# Patient Record
Sex: Female | Born: 1935 | Hispanic: No
Health system: Southern US, Community
[De-identification: ages and names within clinical notes are randomized; demographics above are authoritative.]

---

## 2003-07-31 ENCOUNTER — Other Ambulatory Visit: Payer: Self-pay

## 2004-07-15 ENCOUNTER — Ambulatory Visit: Payer: Self-pay | Admitting: Anesthesiology

## 2004-07-15 ENCOUNTER — Observation Stay: Payer: Self-pay | Admitting: Anesthesiology

## 2004-07-20 ENCOUNTER — Ambulatory Visit: Payer: Self-pay | Admitting: Anesthesiology

## 2004-08-18 ENCOUNTER — Ambulatory Visit: Payer: Self-pay | Admitting: Anesthesiology

## 2004-10-12 ENCOUNTER — Ambulatory Visit: Payer: Self-pay | Admitting: Anesthesiology

## 2004-11-10 ENCOUNTER — Ambulatory Visit: Payer: Self-pay | Admitting: Anesthesiology

## 2004-12-02 ENCOUNTER — Ambulatory Visit: Payer: Self-pay | Admitting: Anesthesiology

## 2005-01-05 ENCOUNTER — Ambulatory Visit: Payer: Self-pay | Admitting: Anesthesiology

## 2005-02-16 ENCOUNTER — Ambulatory Visit: Payer: Self-pay | Admitting: Anesthesiology

## 2005-02-23 ENCOUNTER — Ambulatory Visit: Payer: Self-pay | Admitting: Internal Medicine

## 2005-03-24 ENCOUNTER — Ambulatory Visit: Payer: Self-pay | Admitting: Anesthesiology

## 2005-11-01 ENCOUNTER — Ambulatory Visit: Payer: Self-pay | Admitting: Unknown Physician Specialty

## 2005-11-09 ENCOUNTER — Other Ambulatory Visit: Payer: Self-pay

## 2005-11-09 ENCOUNTER — Ambulatory Visit: Payer: Self-pay | Admitting: Vascular Surgery

## 2005-12-03 ENCOUNTER — Ambulatory Visit: Payer: Self-pay

## 2005-12-20 ENCOUNTER — Ambulatory Visit: Payer: Self-pay | Admitting: Urology

## 2006-01-26 ENCOUNTER — Ambulatory Visit: Payer: Self-pay | Admitting: Unknown Physician Specialty

## 2006-02-25 ENCOUNTER — Ambulatory Visit: Payer: Self-pay | Admitting: Internal Medicine

## 2006-04-21 ENCOUNTER — Ambulatory Visit: Payer: Self-pay | Admitting: Internal Medicine

## 2006-05-12 ENCOUNTER — Ambulatory Visit: Payer: Self-pay | Admitting: Internal Medicine

## 2006-06-11 ENCOUNTER — Ambulatory Visit: Payer: Self-pay | Admitting: Internal Medicine

## 2006-07-26 ENCOUNTER — Ambulatory Visit: Payer: Self-pay | Admitting: Anesthesiology

## 2006-09-13 ENCOUNTER — Ambulatory Visit: Payer: Self-pay | Admitting: Anesthesiology

## 2006-10-12 ENCOUNTER — Ambulatory Visit: Payer: Self-pay | Admitting: Anesthesiology

## 2006-11-29 ENCOUNTER — Ambulatory Visit: Payer: Self-pay | Admitting: Anesthesiology

## 2006-12-07 ENCOUNTER — Ambulatory Visit: Payer: Self-pay | Admitting: Cardiology

## 2007-03-23 ENCOUNTER — Ambulatory Visit: Payer: Self-pay | Admitting: Internal Medicine

## 2007-05-24 ENCOUNTER — Ambulatory Visit: Payer: Self-pay | Admitting: Anesthesiology

## 2007-11-20 ENCOUNTER — Ambulatory Visit: Payer: Self-pay | Admitting: Anesthesiology

## 2007-11-29 ENCOUNTER — Ambulatory Visit: Payer: Self-pay | Admitting: Anesthesiology

## 2008-01-04 ENCOUNTER — Ambulatory Visit: Payer: Self-pay | Admitting: Anesthesiology

## 2008-02-07 ENCOUNTER — Ambulatory Visit: Payer: Self-pay | Admitting: Anesthesiology

## 2008-03-26 ENCOUNTER — Ambulatory Visit: Payer: Self-pay | Admitting: Internal Medicine

## 2008-09-16 ENCOUNTER — Ambulatory Visit: Payer: Self-pay | Admitting: Anesthesiology

## 2008-12-26 ENCOUNTER — Ambulatory Visit: Payer: Self-pay | Admitting: Anesthesiology

## 2009-01-14 ENCOUNTER — Ambulatory Visit: Payer: Self-pay | Admitting: Cardiovascular Disease

## 2009-01-14 ENCOUNTER — Ambulatory Visit: Payer: Self-pay | Admitting: Anesthesiology

## 2009-02-24 ENCOUNTER — Ambulatory Visit: Payer: Self-pay | Admitting: Anesthesiology

## 2009-03-18 ENCOUNTER — Ambulatory Visit: Payer: Self-pay | Admitting: Anesthesiology

## 2009-03-27 ENCOUNTER — Ambulatory Visit: Payer: Self-pay | Admitting: Internal Medicine

## 2009-04-21 ENCOUNTER — Ambulatory Visit: Payer: Self-pay | Admitting: Anesthesiology

## 2009-05-26 ENCOUNTER — Ambulatory Visit: Payer: Self-pay | Admitting: Anesthesiology

## 2010-04-01 ENCOUNTER — Ambulatory Visit: Payer: Self-pay | Admitting: Anesthesiology

## 2010-04-01 ENCOUNTER — Ambulatory Visit: Payer: Self-pay | Admitting: Internal Medicine

## 2010-07-15 ENCOUNTER — Ambulatory Visit: Payer: Self-pay | Admitting: Anesthesiology

## 2010-07-25 ENCOUNTER — Emergency Department: Payer: Self-pay | Admitting: Unknown Physician Specialty

## 2010-07-28 ENCOUNTER — Ambulatory Visit: Payer: Self-pay | Admitting: Anesthesiology

## 2010-07-28 ENCOUNTER — Ambulatory Visit: Payer: Self-pay | Admitting: Internal Medicine

## 2010-08-20 ENCOUNTER — Ambulatory Visit: Payer: Self-pay | Admitting: Anesthesiology

## 2010-10-06 ENCOUNTER — Observation Stay: Payer: Self-pay | Admitting: Family Medicine

## 2010-10-06 ENCOUNTER — Ambulatory Visit: Payer: Self-pay | Admitting: Anesthesiology

## 2010-10-22 ENCOUNTER — Ambulatory Visit: Payer: Self-pay | Admitting: Anesthesiology

## 2010-11-05 ENCOUNTER — Ambulatory Visit: Payer: Self-pay | Admitting: Anesthesiology

## 2010-11-13 ENCOUNTER — Ambulatory Visit: Payer: Self-pay | Admitting: Anesthesiology

## 2010-11-27 ENCOUNTER — Ambulatory Visit: Payer: Self-pay | Admitting: Anesthesiology

## 2011-01-06 ENCOUNTER — Ambulatory Visit: Payer: Self-pay | Admitting: Anesthesiology

## 2011-01-29 ENCOUNTER — Ambulatory Visit: Payer: Self-pay | Admitting: Anesthesiology

## 2011-03-04 ENCOUNTER — Ambulatory Visit: Payer: Self-pay | Admitting: Anesthesiology

## 2011-03-09 ENCOUNTER — Ambulatory Visit: Payer: Self-pay | Admitting: Anesthesiology

## 2011-04-02 ENCOUNTER — Ambulatory Visit: Payer: Self-pay | Admitting: Anesthesiology

## 2011-04-16 ENCOUNTER — Ambulatory Visit: Payer: Self-pay | Admitting: Internal Medicine

## 2011-05-04 ENCOUNTER — Ambulatory Visit: Payer: Self-pay | Admitting: Anesthesiology

## 2011-06-04 IMAGING — CT CT CERVICAL SPINE WITHOUT CONTRAST
2 series · 15 of 20 positions shown, 18 images · non-contrast
Comparison: none

REASON FOR EXAM: neck pain cystic lesion odontoid found on MRI
COMMENTS:

PROCEDURE:     CT  - CT CERVICAL SPINE WO  - November 13, 2010  [DATE]
RESULT:     Comparison: MRI of the cervical spine 10/22/2010
TECHNIQUE: Multiple axial CT images were obtained of the cervical spine,
without intravenous contrast.  Sagittal and coronal reformatted images were
constructed.

[Series 4: coronal · coronal · 0.30mm/px · 3 of 50 slices shown]
[im 10/50  bone]
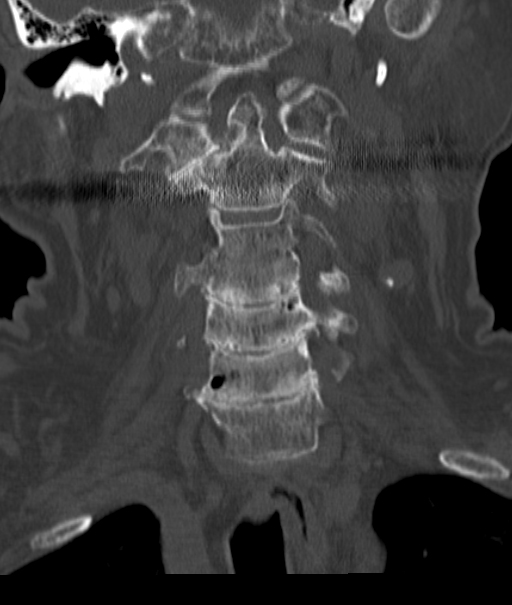
[im 20/50  bone]
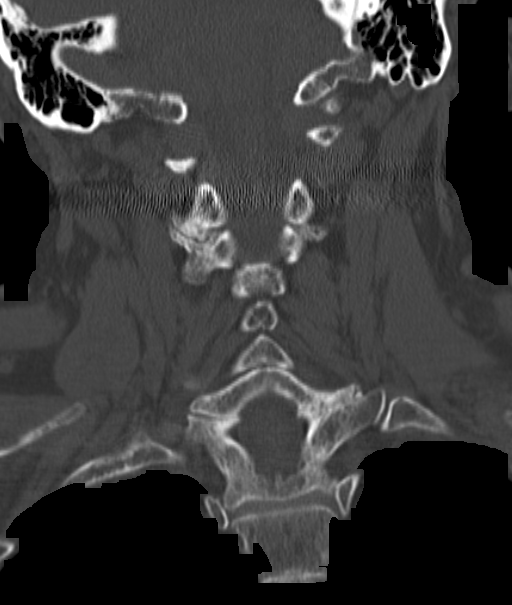
[im 30/50  bone]
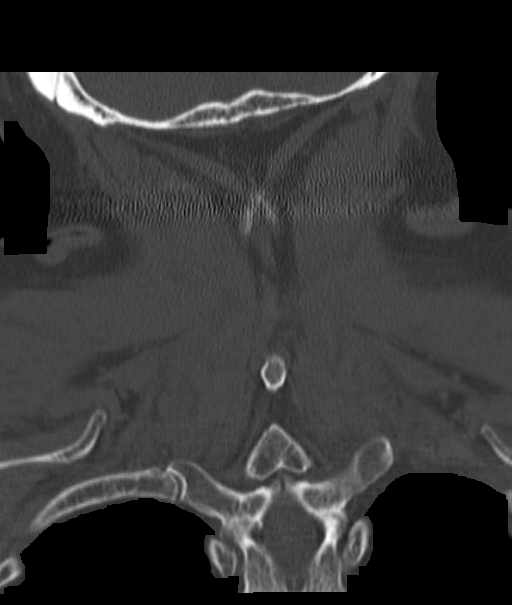

[Series 6: axial · axial · 0.30mm/px · z∈[+1129,+1273]mm · 12 of 86 slices shown, 15 images]
[im 7/86  soft-tissue]
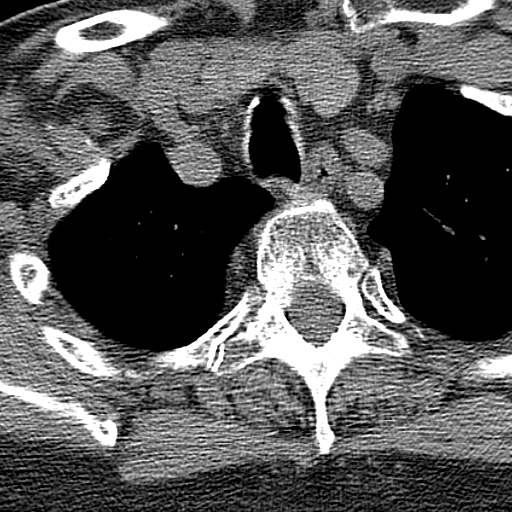
[im 7/86  bone]
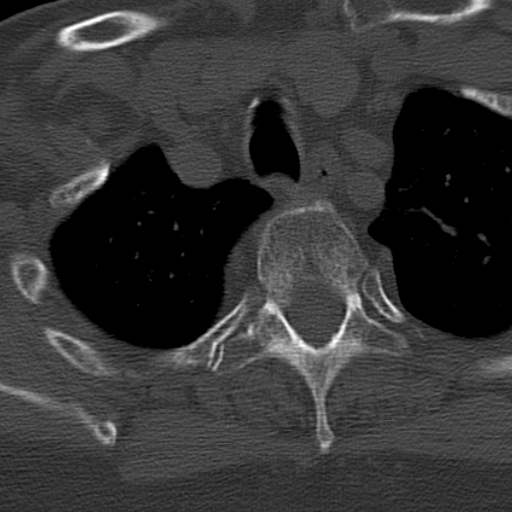
[im 14/86  bone]
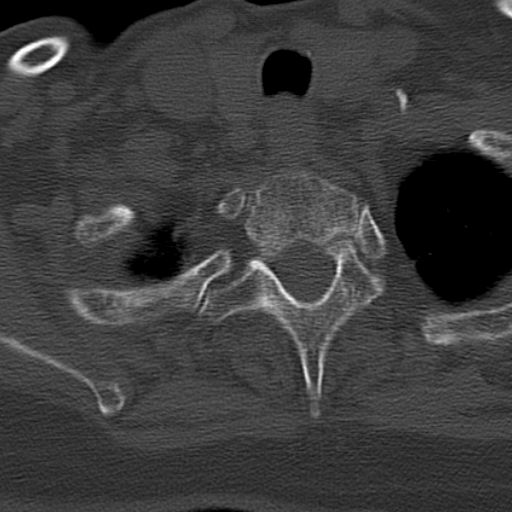
[im 20/86  bone]
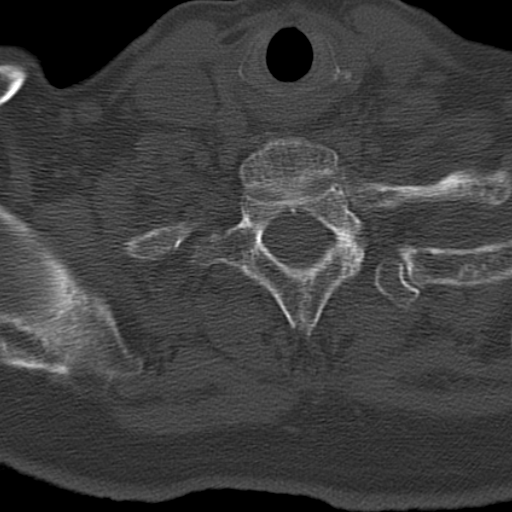
[im 27/86  bone]
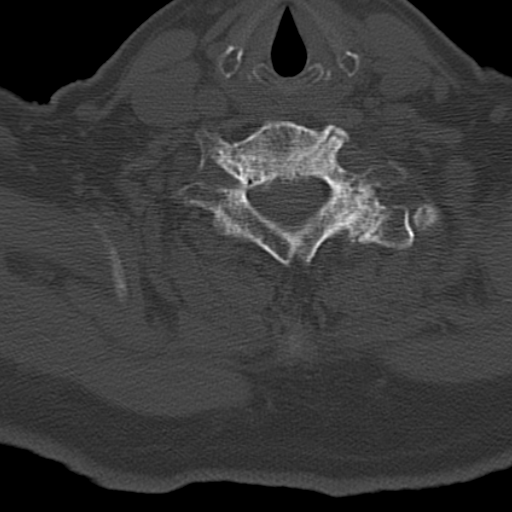
[im 33/86  soft-tissue]
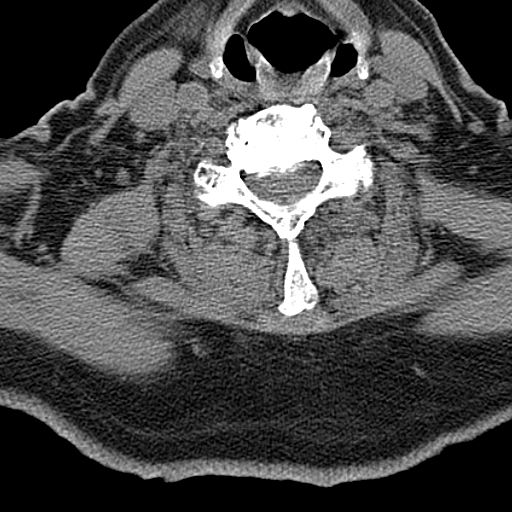
[im 33/86  bone]
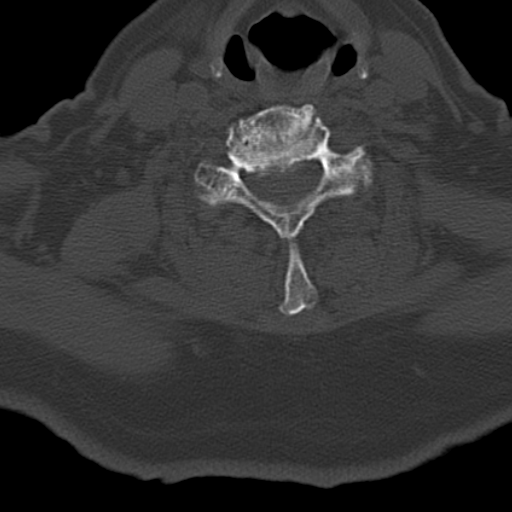
[im 40/86  bone]
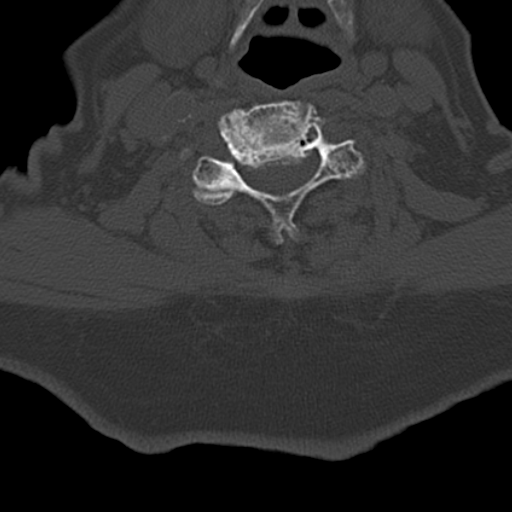
[im 46/86  bone]
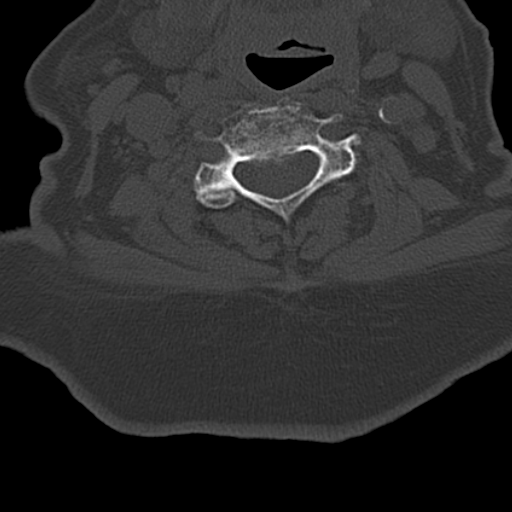
[im 53/86  bone]
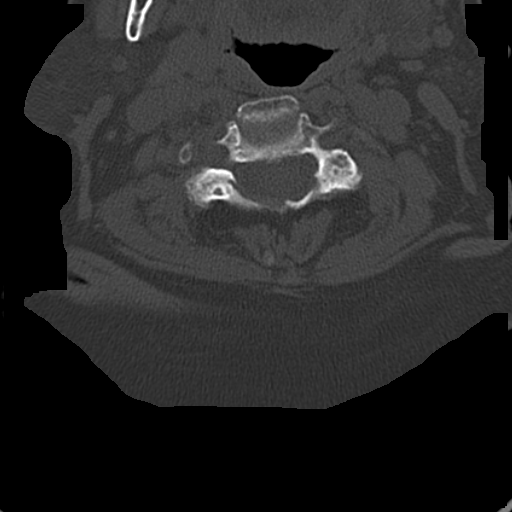
[im 59/86  soft-tissue]
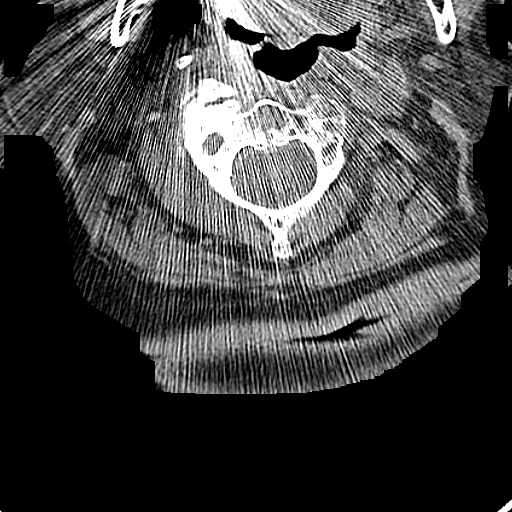
[im 59/86  bone]
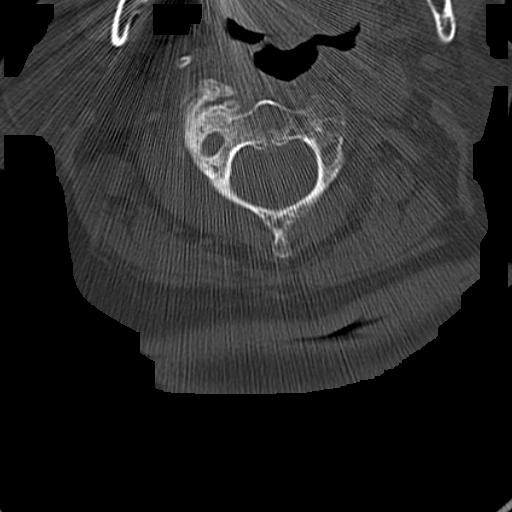
[im 66/86  bone]
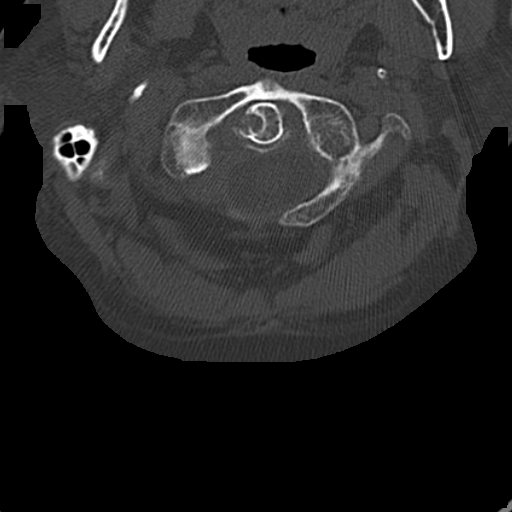
[im 72/86  bone]
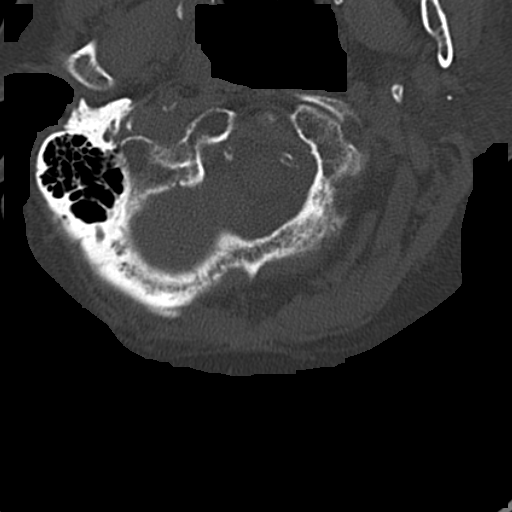
[im 79/86  bone]
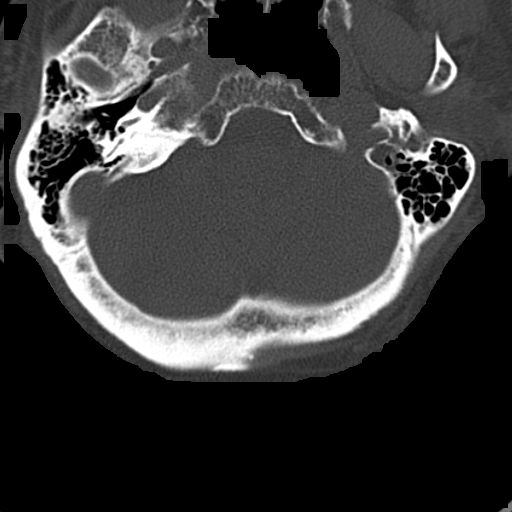

[15 of 20 positions shown; findings below may reference images not displayed]

FINDINGS: Well circumscribed lucency along the lateral margin of the odontoid process
likely represent an erosion, or potentially a subchondral cyst. This appears
to correlate with the questioned cystic lesion on the prior MRI of the
cervical spine. There are proliferative changes about the C1-C2 articulation
as well as mineralization of the alar ligaments. The findings are
nonspecific, but can be seen with CPPD arthropathy or gout.

There is 1-2 mm of anterolisthesis of C2 on C3 and approximately 2 mm of
retrolisthesis of C3 on C4 and C4 and C5. There is osseous fusion of C4-C5.
There is approximately 2 mm of anterolisthesis of C7 on T1.

C2-C3: No canal stenosis or neuroforaminal narrowing.

C3-C4: No canal stenosis or neuroforaminal narrowing.

C4-C5: Mild posterior disc osteophyte causes mild narrowing of the canal.
Uncovertebral hypertrophy causes moderate bilateral neuroforaminal narrowing.

C5-C6: Mild posterior disc osteophyte causes mild narrowing of the spinal
canal. Uncovertebral hypertrophy causes moderate bilateral neuroforaminal
narrowing.

C6-C7: Mild posterior disc osteophyte causes mild narrowing of the spinal
canal. Uncovertebral hypertrophy causes moderate bilateral neuroforaminal
narrowing.

C7-T1: No canal stenosis or neuroforaminal narrowing.
IMPRESSION: 1. Findings at the odontoid process which likely represent an erosion versus
subchondral cyst. There are additional findings at the C1-C2 articulation
which are nonspecific, but can be seen with gout or CPPD arthropathy.
2. Multilevel degenerative disease with multilevel bilateral moderate
neuroforaminal narrowing.

## 2011-06-14 ENCOUNTER — Ambulatory Visit: Payer: Self-pay | Admitting: Anesthesiology

## 2011-07-29 ENCOUNTER — Ambulatory Visit: Payer: Self-pay | Admitting: Anesthesiology

## 2011-08-23 ENCOUNTER — Ambulatory Visit: Payer: Self-pay | Admitting: Pain Medicine

## 2011-08-30 ENCOUNTER — Ambulatory Visit: Payer: Self-pay | Admitting: Pain Medicine

## 2011-09-20 ENCOUNTER — Ambulatory Visit: Payer: Self-pay | Admitting: Pain Medicine

## 2011-10-04 ENCOUNTER — Ambulatory Visit: Payer: Self-pay | Admitting: Pain Medicine

## 2011-10-21 ENCOUNTER — Ambulatory Visit: Payer: Self-pay | Admitting: Pain Medicine

## 2011-11-08 ENCOUNTER — Ambulatory Visit: Payer: Self-pay | Admitting: Pain Medicine

## 2011-11-18 ENCOUNTER — Ambulatory Visit: Payer: Self-pay | Admitting: Pain Medicine

## 2011-12-07 ENCOUNTER — Ambulatory Visit: Payer: Self-pay | Admitting: Pain Medicine

## 2011-12-16 ENCOUNTER — Ambulatory Visit: Payer: Self-pay | Admitting: Pain Medicine

## 2011-12-22 ENCOUNTER — Ambulatory Visit: Payer: Self-pay | Admitting: Pain Medicine

## 2012-01-06 ENCOUNTER — Ambulatory Visit: Payer: Self-pay | Admitting: Pain Medicine

## 2012-01-31 ENCOUNTER — Ambulatory Visit: Payer: Self-pay | Admitting: Pain Medicine

## 2012-02-15 ENCOUNTER — Ambulatory Visit: Payer: Self-pay | Admitting: Pain Medicine

## 2012-03-08 ENCOUNTER — Ambulatory Visit: Payer: Self-pay | Admitting: Pain Medicine

## 2012-06-06 ENCOUNTER — Emergency Department: Payer: Self-pay | Admitting: Unknown Physician Specialty

## 2012-06-06 LAB — URINALYSIS, COMPLETE
Glucose,UR: >=500 mg/dL (ref 0–75)
Nitrite: POSITIVE
Protein: NEGATIVE
RBC,UR: NONE SEEN /HPF (ref 0–5)
Specific Gravity: 1.022 (ref 1.003–1.030)
WBC UR: <1 /HPF (ref 0–5)

## 2012-06-06 LAB — CBC
HGB: 13.2 g/dL (ref 12.0–16.0)
Platelet: 176 10*3/uL (ref 150–440)
RBC: 4.19 10*6/uL (ref 3.80–5.20)

## 2012-06-06 LAB — COMPREHENSIVE METABOLIC PANEL
Alkaline Phosphatase: 72 U/L (ref 50–136)
Calcium, Total: 8.8 mg/dL (ref 8.5–10.1)
Co2: 30 mmol/L (ref 21–32)
EGFR (Non-African Amer.): >60
Glucose: 304 mg/dL — ABNORMAL HIGH (ref 65–99)
Osmolality: 293 (ref 275–301)
SGOT(AST): 20 U/L (ref 15–37)
Sodium: 140 mmol/L (ref 136–145)

## 2012-07-04 ENCOUNTER — Emergency Department: Payer: Self-pay | Admitting: Emergency Medicine

## 2012-07-04 LAB — URINALYSIS, COMPLETE
Ketone: NEGATIVE
Leukocyte Esterase: NEGATIVE
Nitrite: POSITIVE
Protein: NEGATIVE
RBC,UR: 5 /HPF (ref 0–5)
WBC UR: 6 /HPF (ref 0–5)

## 2012-07-04 LAB — CBC
HGB: 12.7 g/dL (ref 12.0–16.0)
MCH: 30.9 pg (ref 26.0–34.0)
MCHC: 34.9 g/dL (ref 32.0–36.0)
MCV: 89 fL (ref 80–100)
RBC: 4.1 10*6/uL (ref 3.80–5.20)
WBC: 4.3 10*3/uL (ref 3.6–11.0)

## 2012-07-04 LAB — COMPREHENSIVE METABOLIC PANEL
Albumin: 3.7 g/dL (ref 3.4–5.0)
Anion Gap: 4 — ABNORMAL LOW (ref 7–16)
BUN: 14 mg/dL (ref 7–18)
Bilirubin,Total: 0.9 mg/dL (ref 0.2–1.0)
Creatinine: 0.64 mg/dL (ref 0.60–1.30)
Glucose: 106 mg/dL — ABNORMAL HIGH (ref 65–99)
Osmolality: 277 (ref 275–301)
Potassium: 4.1 mmol/L (ref 3.5–5.1)
Sodium: 138 mmol/L (ref 136–145)
Total Protein: 6.6 g/dL (ref 6.4–8.2)

## 2012-07-06 ENCOUNTER — Inpatient Hospital Stay: Payer: Self-pay | Admitting: Internal Medicine

## 2012-07-06 LAB — CBC
HCT: 37.3 % (ref 35.0–47.0)
HGB: 13.1 g/dL (ref 12.0–16.0)
MCH: 31.5 pg (ref 26.0–34.0)
MCHC: 35.2 g/dL (ref 32.0–36.0)

## 2012-07-06 LAB — COMPREHENSIVE METABOLIC PANEL
Albumin: 3.6 g/dL (ref 3.4–5.0)
Alkaline Phosphatase: 99 U/L (ref 50–136)
Bilirubin,Total: 0.4 mg/dL (ref 0.2–1.0)
Creatinine: 0.95 mg/dL (ref 0.60–1.30)
Glucose: 122 mg/dL — ABNORMAL HIGH (ref 65–99)
Osmolality: 286 (ref 275–301)
SGPT (ALT): 8 U/L — ABNORMAL LOW (ref 12–78)
Sodium: 141 mmol/L (ref 136–145)

## 2012-07-06 LAB — URINE CULTURE

## 2012-07-06 LAB — URINALYSIS, COMPLETE
Glucose,UR: NEGATIVE mg/dL (ref 0–75)
Nitrite: NEGATIVE
Ph: 5 (ref 4.5–8.0)
Protein: NEGATIVE
RBC,UR: 1 /HPF (ref 0–5)

## 2012-07-06 LAB — TROPONIN I: Troponin-I: <0.02 ng/mL

## 2012-07-08 LAB — BASIC METABOLIC PANEL
Anion Gap: 7 (ref 7–16)
BUN: 20 mg/dL — ABNORMAL HIGH (ref 7–18)
Calcium, Total: 9.1 mg/dL (ref 8.5–10.1)
Chloride: 103 mmol/L (ref 98–107)
Co2: 30 mmol/L (ref 21–32)
Osmolality: 282 (ref 275–301)
Potassium: 3.9 mmol/L (ref 3.5–5.1)

## 2012-07-08 LAB — CBC WITH DIFFERENTIAL/PLATELET
Basophil #: 0 10*3/uL (ref 0.0–0.1)
Eosinophil #: 0.3 10*3/uL (ref 0.0–0.7)
Lymphocyte %: 41.4 %
MCH: 31.4 pg (ref 26.0–34.0)
MCHC: 35.5 g/dL (ref 32.0–36.0)
Monocyte #: 0.5 x10 3/mm (ref 0.2–0.9)
Neutrophil %: 41.5 %
RDW: 13.3 % (ref 11.5–14.5)

## 2012-07-10 LAB — CULTURE, BLOOD (SINGLE)

## 2012-08-21 ENCOUNTER — Ambulatory Visit: Payer: Self-pay | Admitting: Neurology

## 2012-08-29 ENCOUNTER — Emergency Department: Payer: Self-pay | Admitting: Emergency Medicine

## 2012-08-29 LAB — COMPREHENSIVE METABOLIC PANEL
Alkaline Phosphatase: 74 U/L (ref 50–136)
Chloride: 104 mmol/L (ref 98–107)
Co2: 31 mmol/L (ref 21–32)
Creatinine: 0.94 mg/dL (ref 0.60–1.30)
Glucose: 229 mg/dL — ABNORMAL HIGH (ref 65–99)
Osmolality: 290 (ref 275–301)
Potassium: 4 mmol/L (ref 3.5–5.1)
SGOT(AST): 11 U/L — ABNORMAL LOW (ref 15–37)
SGPT (ALT): 13 U/L (ref 12–78)
Total Protein: 7.7 g/dL (ref 6.4–8.2)

## 2012-08-29 LAB — URINALYSIS, COMPLETE
Blood: NEGATIVE
Glucose,UR: 150 mg/dL (ref 0–75)
Ketone: NEGATIVE
Protein: NEGATIVE
RBC,UR: 3 /HPF (ref 0–5)
Specific Gravity: 1.023 (ref 1.003–1.030)
Squamous Epithelial: 1

## 2012-08-29 LAB — CBC
RBC: 4.11 10*6/uL (ref 3.80–5.20)
WBC: 4.9 10*3/uL (ref 3.6–11.0)

## 2012-08-30 ENCOUNTER — Encounter (HOSPITAL_COMMUNITY): Payer: Self-pay

## 2012-08-30 ENCOUNTER — Inpatient Hospital Stay (HOSPITAL_COMMUNITY)
Admission: EM | Admit: 2012-08-30 | Discharge: 2012-08-30 | DRG: 064 | Disposition: A | Discharge status: Hospice | Payer: Medicare Other | Source: Other Acute Inpatient Hospital | Attending: Neurology | Admitting: Neurology

## 2012-08-30 DIAGNOSIS — G8190 Hemiplegia, unspecified affecting unspecified side: Secondary | ICD-10-CM | POA: Diagnosis present

## 2012-08-30 DIAGNOSIS — R4020 Unspecified coma: Secondary | ICD-10-CM

## 2012-08-30 DIAGNOSIS — Z66 Do not resuscitate: Secondary | ICD-10-CM | POA: Diagnosis present

## 2012-08-30 DIAGNOSIS — I2510 Atherosclerotic heart disease of native coronary artery without angina pectoris: Secondary | ICD-10-CM

## 2012-08-30 DIAGNOSIS — E785 Hyperlipidemia, unspecified: Secondary | ICD-10-CM | POA: Diagnosis present

## 2012-08-30 DIAGNOSIS — I10 Essential (primary) hypertension: Secondary | ICD-10-CM

## 2012-08-30 DIAGNOSIS — Z515 Encounter for palliative care: Secondary | ICD-10-CM

## 2012-08-30 DIAGNOSIS — R4701 Aphasia: Secondary | ICD-10-CM

## 2012-08-30 DIAGNOSIS — E109 Type 1 diabetes mellitus without complications: Secondary | ICD-10-CM | POA: Diagnosis present

## 2012-08-30 DIAGNOSIS — Z8673 Personal history of transient ischemic attack (TIA), and cerebral infarction without residual deficits: Secondary | ICD-10-CM

## 2012-08-30 DIAGNOSIS — Z9282 Status post administration of tPA (rtPA) in a different facility within the last 24 hours prior to admission to current facility: Secondary | ICD-10-CM

## 2012-08-30 DIAGNOSIS — G8191 Hemiplegia, unspecified affecting right dominant side: Secondary | ICD-10-CM

## 2012-08-30 DIAGNOSIS — Z951 Presence of aortocoronary bypass graft: Secondary | ICD-10-CM

## 2012-08-30 DIAGNOSIS — I6340 Cerebral infarction due to embolism of unspecified cerebral artery: Principal | ICD-10-CM | POA: Diagnosis present

## 2012-08-30 LAB — MRSA PCR SCREENING: MRSA by PCR: NEGATIVE

## 2012-08-30 MED ORDER — SENNOSIDES-DOCUSATE SODIUM 8.6-50 MG PO TABS: 1.0000 | ORAL_TABLET | Freq: Every evening | ORAL | Status: DC | PRN

## 2012-08-30 MED ORDER — PANTOPRAZOLE SODIUM 40 MG IV SOLR: 40.0000 mg | Freq: Every day | INTRAVENOUS | Status: DC

## 2012-08-30 MED ORDER — ACETAMINOPHEN 325 MG PO TABS: 650.0000 mg | ORAL_TABLET | ORAL | Status: DC | PRN

## 2012-08-30 MED ORDER — ACETAMINOPHEN 650 MG RE SUPP: 650.0000 mg | RECTAL | Status: DC | PRN

## 2012-08-30 MED ORDER — LABETALOL HCL 5 MG/ML IV SOLN
10.0000 mg | INTRAVENOUS | Status: DC | PRN
  Administered 2012-08-30 (×3): 10 mg via INTRAVENOUS
  Filled 2012-08-30 (×2): qty 4

## 2012-08-30 MED ORDER — SODIUM CHLORIDE 0.9 % IV SOLN
INTRAVENOUS | Status: DC
  Administered 2012-08-30: 03:00:00 via INTRAVENOUS

## 2012-08-30 MED ORDER — MORPHINE SULFATE 2 MG/ML IJ SOLN
INTRAMUSCULAR | Status: AC
  Administered 2012-08-30: 1 mg via INTRAVENOUS
  Filled 2012-08-30: qty 1

## 2012-08-30 MED ORDER — ONDANSETRON HCL 4 MG/2ML IJ SOLN: 4.0000 mg | Freq: Four times a day (QID) | INTRAMUSCULAR | Status: DC | PRN

## 2012-09-09 NOTE — Progress Notes (Signed)
Palliative Medicine Team consult received- discussed with patient's daughter, [name] at bedside and with Dr Pearlean Brownie - attending team managing symptoms at this time; daughter tearful stated she realizes patient's condition is grave and their wishes are for full comfort at this time; emotional support offered -daughter initially agreeable to meeting with PMT however, per Dr Pearlean Brownie there is not a need for PMT to engage at this time; PMT available for family support or additional symptom management if needed - will sign off - please re-consult if PMT can be of assistance  [name], RN 08/23/2012, 11:48 AM Palliative Medicine Team RN Liaison [phone]

## 2012-09-09 NOTE — Progress Notes (Signed)
Stroke PA notified of patient's elevated BP. Morphine 1mg  ordered. Will give ASAP. Will continue to assess.

## 2012-09-09 NOTE — Progress Notes (Addendum)
Spoke with Annie Main, NP re: pt's needs for d/c back to [facility] independent living with Hospice care. [place] uses [employer].  Contacted Denise with referral [phone]).   Jasmine December said that first we should attempt O2 wean for patient to determine if she needs that at home for comfort.  Pt was at 96% on O2 4L.  At 1430, turned her down to 2L.  Sats dropped to 94% but held steady there and pt's son-in-law in the room (a physician) said that he didn't note any change in her level of comfort. Went back at 1445 and turned her O2 off.  Within first 15 minutes pt's sats dropped from 94 to between 84-87%.  The patient is exhibiting no increased work of breathing or other signs of discomfort.  Spoke again to Annie Main, NP who said that at this time we would not order home O2.  Faxed facesheet, progress notes, orders to [facility] at [phone]. Received call at 1535 that fax was received and that the bed could be delivered to the patient's home today.  Pt discharging to:  [address], [place], [place]; c/o [employer]  Daughter Jesse Fall will be there to receive the delivery and set up of the hospital bed.  Her cell is [phone].  NP Guy Franco notified that pt will be able to d/c home today so that she can do discharge summary.

## 2012-09-09 NOTE — Consult Note (Signed)
Referring Physician: ED [facility].    Chief Complaint: stroke status post IV tpa.   HPI:                                                                                                                                         Debra Bailey is an 79 y.o. female female with a past medical history significant for hypertension, diabetes type 1, hyperlipidemia, CAD status post CABG few years ago, TIA's characterized by language dysfunction (last TIA Dec 2013), transferred from [facility] for further stroke post IV tpa. I did review the available clinical notes, and also spoke to patient's family member as well as the ED physician involved in the decision to give IV thrombolysis and they are in agreement that Debra Bailey was last seen normal around 7 pm tonight when she was found to have difficulty expressing herself which prompted an urgent evaluation at [facility] ED. Upon arrival to that institution, she was described as " aphasic, motor greater than receptive aphasia, with right sided weakness and overall NIHSS 7".  CT brain did not show acute intracranial abnormality and It was determined that she met criteria for IV thrombolysis which was administer approximately  2 1/2 hour after onset of symptoms. Nursing staff here at the NICU is concerned that she is less responsive that when she arrived to the unit. Importantly, she is a DNR/DNI and family expressed their wishes of no aggressive intervention.  LSN: 7 pm tonight. tPA Given: yes, as she fulfilled inclusion and exclusion criteria for IV thrombolysis.   No past surgical history on file.  No family history on file. Social History:  has no tobacco, alcohol, and drug history on file.  Allergies: Allergies not on file  Medications:                                                                                                                           I have reviewed the patient's current  medications.  ROS:  History obtained from unobtainable from patient due to mental status  Physical exam: unresponsive but no distress.Blood pressure 189/74, pulse 80, resp. rate 19, weight 69 kg (152 lb 1.9 oz), SpO2 100.00%. Head: normocephalic. Neck: supple, no bruits, no JVD. Cardiac: no murmurs. Lungs: clear. Abdomen: soft, no tender, no mass. Extremities: no edema.   Neurologic Examination:                                                                                                      Mental Status: Unresponsive. Cranial Nerves: Pupils 4 mm bilaterally, reactive to light. No gaze preference. Doesn't blink to threat. EOM intact on doll's maneuver. No nystagmus. Face seems to be asymmetric, with right sided weakness. Motor: Dense right hemiplegia. Sensory: withdraws to noxious stimuli but doesn't localize pain. Deep Tendon Reflexes: 2+ and symmetric throughout Plantars: upgoing bilaterally. Cerebellar: Can not be tested. Gait: can not be tested. CV: pulses palpable throughout        Assessment: 79 y.o. female with multiple risk factors for stroke, now with a left cortical syndrome suggestive in the context of a probable left MCA territory ischemic infarct. She is unresponsive with dense right hemiplegia, and was globally aphasic upon initial evaluation in the ED.  Declining mental status since arrival to the NICU but she is DNR/DNI and family is not in favor of aggressive intervention, and thus I decided no to request an earlier CT brain now to exclude the possibility of post tpa cerebral hemorrhage as the results of the test will not change family decision regarding aggressive intervention. Will admit and follow post IV tpa protocol. Stroke team to resume care in the morning and make further recommendations accordingly.  Wyatt Portela,  MD [facility] [phone]  11-Sep-2012, 1:04 AM

## 2012-09-09 NOTE — Progress Notes (Signed)
Stroke Team Progress Note  HISTORY Debra Bailey is an 79 y.o. female female with a past medical history significant for hypertension, diabetes type 1, hyperlipidemia, CAD status post CABG few years ago, TIA's characterized by language dysfunction (last TIA Dec 2013), transferred from [facility] for further stroke post IV tpa.  I did review the available clinical notes, and also spoke to patient's family member as well as the ED physician involved in the decision to give IV thrombolysis and they are in agreement that Debra Bailey was last seen normal around 7 pm tonight when she was found to have difficulty expressing herself which prompted an urgent evaluation at [facility] ED. Upon arrival to that institution, she was described as " aphasic, motor greater than receptive aphasia, with right sided weakness and overall NIHSS 7". CT brain did not show acute intracranial abnormality and It was determined that she met criteria for IV thrombolysis which was administer approximately 2 1/2 hour after onset of symptoms.  Nursing staff here at the NICU is concerned that she is less responsive that when she arrived to the unit.  Importantly, she is a DNR/DNI and family expressed their wishes of no aggressive intervention.  LSN: 7 pm 08/29/12 tPA Given: yes, as she fulfilled inclusion and exclusion criteria for IV thrombolysis.  The patient`s neurological condition declined enroute in transit to [facility] last pm  and family decided on comfort care and no aggressive treatment and declined doing CT head to determine cause of neurological decline. I spoke with patient`s son in law and daughter both who are power of attorney and confirmed this. Daughter informs me that patient had 4-5 episodes of transient expressive language difficulties previously felt to be TIAs but recently Dr Sherryll Burger neurologist in [place] felt these could be partial seizures. She had also had memory decline and several  falls in last 4-5 months.     SUBJECTIVE Her  daughter is at the bedside.  Overall she feels her condition is unchanged OBJECTIVE Most recent Vital Signs: Filed Vitals:   08/23/2012 0800 09/06/2012 0900 09/05/2012 0930 08/13/2012 0945  BP: 187/84 195/78 195/78 177/77  Pulse: 103 103 107 100  Temp:      TempSrc:      Resp: 18 18 19 19   Height:      Weight:      SpO2: 100% 100% 100% 100%   CBG (last 3)  No results found for this basename: GLUCAP,  in the last 72 hours  IV Fluid Intake:   . sodium chloride 75 mL/hr at 08/16/2012 0900    MEDICATIONS   PRN:  acetaminophen, acetaminophen, labetalol, ondansetron (ZOFRAN) IV  Diet:  NPO   Activity:  Bedrest  DVT Prophylaxis:  SCDs CLINICALLY SIGNIFICANT STUDIES Basic Metabolic Panel: No results found for this basename: NA, K, CL, CO2, GLUCOSE, BUN, CREATININE, CALCIUM, MG, PHOS,  in the last 168 hours Liver Function Tests: No results found for this basename: AST, ALT, ALKPHOS, BILITOT, PROT, ALBUMIN,  in the last 168 hours CBC: No results found for this basename: WBC, NEUTROABS, HGB, HCT, MCV, PLT,  in the last 168 hours Coagulation: No results found for this basename: LABPROT, INR,  in the last 168 hours Cardiac Enzymes: No results found for this basename: CKTOTAL, CKMB, CKMBINDEX, TROPONINI,  in the last 168 hours Urinalysis: No results found for this basename: COLORURINE, APPERANCEUR, LABSPEC, PHURINE, GLUCOSEU, HGBUR, BILIRUBINUR, KETONESUR, PROTEINUR, UROBILINOGEN, NITRITE, LEUKOCYTESUR,  in the last 168 hours Lipid Panel No results found  for this basename: chol, trig, hdl, cholhdl, vldl, ldlcalc   HgbA1C  No results found for this basename: HGBA1C    Urine Drug Screen:   No results found for this basename: labopia, cocainscrnur, labbenz, amphetmu, thcu, labbarb    Alcohol Level: No results found for this basename: ETH,  in the last 168 hours  No results found.  CT of the brain   At [facility] old right basal ganglia lacune and  moderte generalized atrophy and small vessel disease changes.  MRI of the brain  Not done  MRA of the brain  Not done  2D Echocardiogram   Not done Carotid Doppler   Not done CXR    EKG   Not done Therapy Recommendations deferred  Physical Exam  Frail elderly Caucasian lady njot in distresss.  Afebrile. Head is nontraumatic. Neck is supple without bruit. . Cardiac exam no murmur or gallop. Lungs are clear to auscultation. Distal pulses are well felt. Neurological Exam : comatose unresponsive. Pupils 6 mm fixed. corneals sluggish. Dolls eye movements absent. Face symmetric. Tongue midline. Weak cough and gag. No spontaneous limb movements. Minimal withdrwal in LE to pain and none in UE,Plantars upgoing bilaterally. ASSESSMENT Ms. Debra Bailey is a 79 y.o. female  with multiple risk factors for stroke, presented to [facility] with Lt MCA branch infarct y`day pm and given ivTPA but neurologically declined in transit - unclear wether from post tpa hemorrhage versus new bilateral infarcts but she is DNR/DNI and family is not in favor of aggressive intervention, and thus I decided no to request an earlier CT brain now to exclude the possibility of post tpa cerebral hemorrhage as the results of the test will not change family decision regarding aggressive intervention.  Hospital day # 0  TREATMENT/PLAN Continue comfort care DNR/DNI. Transfer to palliative care unit and possible palliative care in SNF in [place] if family desires D/W daughter and son in law who have HPOA   Delia Heady, MD Medical Director [facility] Pager: [phone] 08/17/2012 10:16 AM

## 2012-09-09 NOTE — Discharge Summary (Signed)
Stroke Discharge Summary  Patient ID: Debra Bailey   MRN: [id number]      DOB: May 30, 1926  Date of Admission: 09/06/12 Date of Discharge: 09/06/2012  Attending Physician:  Darcella Cheshire, MD, Stroke MD  Consulting Physician(s):   Treatment Team:  Md Stroke, MD Palliative Doristine Johns  Patient's PCP:  Barbette Reichmann, MD  Discharge Diagnoses:  Principal Problem:   Right hemiplegia Active Problems:   Receptive aphasia   Accelerated hypertension   Coronary artery disease   Comatose  BMI: Body mass index is 28.76 kg/(m^2).     Medication List    STOP taking these medications       acetaminophen 500 MG tablet  Commonly known as:  TYLENOL     carbidopa-levodopa 25-100 MG per tablet  Commonly known as:  SINEMET CR     clopidogrel 75 MG tablet  Commonly known as:  PLAVIX     docusate sodium 100 MG capsule  Commonly known as:  COLACE     DULoxetine 30 MG capsule  Commonly known as:  CYMBALTA     gabapentin 300 MG capsule  Commonly known as:  NEURONTIN     glyBURIDE-metformin 5-500 MG per tablet  Commonly known as:  GLUCOVANCE     metoprolol succinate 50 MG 24 hr tablet  Commonly known as:  TOPROL-XL     nitroGLYCERIN 0.4 MG SL tablet  Commonly known as:  NITROSTAT     pravastatin 40 MG tablet  Commonly known as:  PRAVACHOL     vitamin C 500 MG tablet  Commonly known as:  ASCORBIC ACID     Vitamin D3 1000 UNITS Caps        LABORATORY STUDIES:   Patient arrived at [facility] from [facility], see that facility labs. CBC No results found for this basename: wbc, rbc, hgb, hct, plt, mcv, mch, mchc, rdw, neutrabs, lymphsabs, monoabs, eosabs, basosabs   CMP No results found for this basename: na, k, cl, co2, glucose, bun, creatinine, calcium, prot, albumin, ast, alt, alkphos, bilitot, gfrnonaa, gfraa   COAGS No results found for this basename: INR, PROTIME   Lipid Panel No results found for this basename: chol, trig, hdl, cholhdl,  vldl, ldlcalc   HgbA1C  No results found for this basename: HGBA1C   Cardiac Panel (last 3 results) No results found for this basename: CKTOTAL, CKMB, TROPONINI, RELINDX,  in the last 72 hours Urinalysis No results found for this basename: colorurine, appearanceur, labspec, phurine, glucoseu, hgbur, bilirubinur, ketonesur, proteinur, urobilinogen, nitrite, leukocytesur   Urine Drug Screen  No results found for this basename: labopia, cocainscrnur, labbenz, amphetmu, thcu, labbarb    Alcohol Level No results found for this basename: eth    SIGNIFICANT DIAGNOSTIC STUDIES  CT of the brain At [facility] old right basal ganglia lacune and moderte generalized atrophy and small vessel disease changes.     History of Present Illness   Debra Bailey is an 79 y.o. female female with a past medical history significant for hypertension, diabetes type 1, hyperlipidemia, CAD status post CABG few years ago, TIA's characterized by language dysfunction (last TIA Dec 2013), transferred from [facility] for further stroke post IV tpa.   Dr. Pearlean Brownie reviewed the available clinical notes, and also spoke to patient's family member as well as the ED physician involved in the decision to give IV thrombolysis and they are in agreement that Debra Bailey was last seen normal around 7 pm 08/29/2012 when she was found to  have difficulty expressing herself which prompted an urgent evaluation at [facility] ED. Upon arrival to that institution, she was described as " aphasic, motor greater than receptive aphasia, with right sided weakness and overall NIHSS 7". CT brain did not show acute intracranial abnormality and It was determined that she met criteria for IV thrombolysis which was administer approximately 2 1/2 hour after onset of symptoms.   Of note, daughter did inform Dr. Pearlean Brownie that patient had 4-5 episodes of transient expressive language difficulties previously felt to be TIAs but  recently Dr Sherryll Burger neurologist in [place] felt these could be partial seizures. She had also had memory decline and several falls in last 4-5 months   LSN: 7 pm   08/29/12   tPA Given: yes, as she fulfilled inclusion and exclusion criteria for IV thrombolysis.   The patient`s neurological condition declined enroute in transit to [facility] last pm and family decided on comfort care and no aggressive treatment and declined doing CT head to determine cause of neurological decline. Dr Pearlean Brownie discussed with patient`s son in law and daughter both who are power of attorney and confirmed this. Patient will be discharged to [facility] with Hospice End of Life Care.  Discharge Exam  Blood pressure 194/82, pulse 102, temperature 100.6 F (38.1 C), temperature source Axillary, resp. rate 26, height 5\' 1"  (1.549 m), weight 69 kg (152 lb 1.9 oz), SpO2 83.00%. I have been notified by phone by discharge team/case manager Marcelino Duster that the patient has been taken off of O2 by nasal canula and her oxygen saturations are above 90%. Family has felt that she is not suffering.  Physical Exam Frail elderly Caucasian lady njot in distresss. Afebrile. Head is nontraumatic. Neck is supple without bruit. . Cardiac exam no murmur or gallop. Lungs are clear to auscultation. Distal pulses are well felt.  Neurological Exam : comatose unresponsive. Pupils 6 mm fixed. corneals sluggish. Dolls eye movements absent. Face symmetric. Tongue midline. Weak cough and gag. No spontaneous limb movements. Minimal withdrwal in LE to pain and none in UE,Plantars upgoing bilaterally  Discharge Diet   NPO    Discharge Plan    Disposition:  To [facility]isted Living with Hospice Care   May contact [facility], MD (primary MD) or  Dr. Delia Heady (neurologist) for questions  End of life treatment as outlined per Hospice: comfort care.  40 minutes were spent preparing discharge.  Signed  Gwendolyn Lima. Manson Passey, [facility], MBA,  MHA [facility] Pager: [phone] 08/20/2012 5:08 PM  I have personally examined this patient, reviewed pertinent data and developed the plan of care. I agree with above.  Delia Heady, MD Medical Director [facility] Pager: [phone] 09/05/2012 1:04 PM

## 2012-09-09 NOTE — Progress Notes (Signed)
UR completed 

## 2012-09-09 NOTE — Clinical Social Work Note (Signed)
Clinical Social Worker met with patient son in law at the bedside to further discuss patient discharge plans.  CSW spoke with [facility] Independent Living RN who states that patient may return to independent living with Hospice following as long as patient family was willing to provide live in care.  Patient family is agreeable with the plan.  CM making arrangements with [facility] for hospital bed/oxygen needed for patient return home.  Patient family understanding of patient return tomorrow pending hospice equipment and set up prepared.  CSW remains available for support as needed.  Macario Golds, [place] [phone]

## 2012-09-09 NOTE — Progress Notes (Signed)
Nutrition Brief Note  Chart reviewed. Family does not want aggressive care. Per RN pt will be transitioning to comfort care.  No nutrition interventions warranted at this time.  Please re-consult as needed.   Kendell Bane RD, LDN, CNSC [phone] Pager [phone] After Hours Pager

## 2012-09-09 NOTE — Clinical Social Work Psychosocial (Signed)
     Clinical Social Work Department BRIEF PSYCHOSOCIAL ASSESSMENT 08/12/2012  Patient:  Debra Bailey,Debra Bailey     Account Number:  [id number]     Admit date:  09/02/2012  Clinical Social Worker:  Verl Blalock  Date/Time:  08/27/2012 12:00 N  Referred by:  Physician  Date Referred:  08/20/2012 Referred for  Residential hospice placement   Other Referral:   Patient from [facility]   Interview type:  Family Other interview type:   Patient daughter at bedside    PSYCHOSOCIAL DATA Living Status:  FACILITY Admitted from facility:  [facility] Level of care:  Independent Living Primary support name:  Debra Bailey  [phone] Primary support relationship to patient:  CHILD, ADULT Degree of support available:   Strong    CURRENT CONCERNS Current Concerns  Post-Acute Placement   Other Concerns:   Patient to have Hospice following    SOCIAL WORK ASSESSMENT / PLAN Clinical Social Worker met with patient daughter at bedside to offer support and discuss patient needs at discharge. Patient daughter states that patient was a resident at [facility] at the time of patient hospitalization.  Patient has lived there for many years. Patient has a boyfriend that lives 2 doors down named Debra Bailey and they have been together for 2 years and 7 months. Patient daughter feels that patient wishes would be for patient to return to [facility] with Hospice following.  Patient daughter is willing to stay around the clock with patient as needed.  CSW contacted facility and awaits return call to determine if that is a possibility.    Patient daughter states that if patient is unable to return to her Independent apartment with Hospice following she would like her to return to her home with Hospice following.  Patient family is anxious to get patient settled where she will be comfortable.  CSW will remain available for emotional support and to  facilitate patient discharge needs.   Assessment/plan status:  Psychosocial Support/Ongoing Assessment of Needs Other assessment/ plan:   Information/referral to community resources:   Patient daughter is retired from Teacher, music and is well networked in the [employer].  Patient daughter is willing to discuss Hospice options but is agreeable to whatever agency is used by [facility].    PATIENTS/FAMILYS RESPONSE TO PLAN OF CARE: Patient is currently unresponsive laying in the bed with full comfort care measures being carried out.  Patient daughter remains at bedside and expressed some tearful concerns regarding the findings from MD but understands that patient is actively dying and just wants her to be comfortable.  Patient and patient family with good supports and are coping appropriately.  Patient daughter verbalized her appreciation for CSW support and concern.

## 2012-09-09 NOTE — Progress Notes (Signed)
Palliative Medicine Team SW Pt discussed in PMT rounds, came by to offer emotional support to dtr prior to knowledge of cancelled PMT consult. Dtr very appreciative of visit and opportunity to express her relief that pt "is the most comfortable I've seen her in a long time". Dtr reports pt has had several medical complications in the recent past. Dtr expressed good coping knowing that death is "a part of life". Dtr engaged in some reminiscence therapy about pt's life raising three children and her social support at the independent living facility. Dtr reports pt's two sons live in [place] and are aware of pt's decline. During our visit, pt's significant other from [facility] arrived and was quite tearful at seeing her "this way". Provided emotional support to pt's s/o who reports being with pt when she had her "episode" and his shock that her prognosis is now so poor. S/o reports he has never had much experience with end of life within the hospital setting, even with his own parent and wife who died 10 years ago. S/o finds visiting pt very distressing; affirmed his right to choose to remember her "as she was". Encouraged good self care to all, provided my contact info as needs arise. Will defer to primary team CSW for further emotional support needs.   Kennieth Francois, LCSWA PMT phone [phone] Pager [phone]

## 2012-09-09 NOTE — Progress Notes (Signed)
Patient ID: Debra Bailey, female   DOB: [birth year], 79 y.o.   MRN: [id number]  Dr. Pearlean Brownie had a long conversation with the patient's daughter in the room this morning and the patient's son-in-law by telephone. The patient's family would like comfort care measures only. Please refer to Dr. Marlis Edelson complete note from earlier this morning. Arrangements will be made to transfer the patient to [id number].  Delton See PA-C [facility] Pager [phone] 2012/09/20, 11:31 AM Delia Heady, MD Medical Director [facility] Pager: [phone] 09/20/2012 5:51 PM

## 2012-09-09 DEATH — deceased

## 2014-10-29 NOTE — Discharge Summary (Signed)
DATE OF BIRTH:  [birth year]  DATE OF ADMISSION:  07/06/2012 DATE OF DISCHARGE:  07/08/2012  DISCHARGE DIAGNOSES:  1.  Encephalopathy.  2.  Urinary tract infection.  3.  Possible aphasia and transient ischemic attack.  4.  Coronary artery disease, stable.   DISCHARGE MEDICATIONS:  Per discharge instruction sheet.   HOSPITAL COURSE:  The patient was admitted with speech irregularities felt to be aphasia, babbling speech intermittently for 48 hours per her boyfriend, who lives with her. She slowly improved over the course of her hospitalization such that her speech was normal and her usual speech pattern per her boyfriend. She may have had some mild confusion still from my observation. Again, this was thought to be her usual state of speaking. Echocardiogram was done, which was normal. MRI of her brain was done on the 27th, which looked normal. She had no atrial fibrillation noted on prolonged telemetry findings. She was on aspirin for coronary artery disease presently. This will be switched to Plavix. Head CT was normal on admission, as were troponins. She had a urine culture done on the 24th, which showed Enterobacter, which was sensitive to ciprofloxacin. So, she will be continued on a 7-day course of that, which had been started prior to hospitalization. I discussed with her (Dictation Anomaly)today to follow up soon with Dr. Marcello FennelHande, and this will be on the discharge handout that she gets as well. She understands to present quickly if she has recurrent aphasia symptoms, and her boyfriend agrees to bring her or call EMS depending on the situation where they are and how close to the hospital they are, etc.   Please note it took approximately 35 minutes to do all discharge tasks.    ____________________________ Marya AmslerMarshall W. Dareen PianoAnderson, MD mwa:ms D: 07/08/2012 09:34:34 ET T: 07/09/2012 17:26:28 ET JOB#: [id number]  cc: Marya AmslerMarshall W. Dareen PianoAnderson, MD, <Dictator> Lauro RegulusMARSHALL W Geetika Laborde MD ELECTRONICALLY SIGNED  07/10/2012 7:48

## 2014-10-29 NOTE — H&P (Signed)
DATE OF BIRTH:  [birth year]  PRIMARY CARE PHYSICIAN:  Dr. Marcello FennelHande   CHIEF COMPLAINT:  Slurred speech and aphasia.   HISTORY OF PRESENT ILLNESS:  This is an 81 year old female, who presents to the Emergency Room due to difficulty talking/aphasia and also slurred speech. The patient resides at [facility] when one of her friends noticed that she woke up this afternoon and was talking, but not making any sense. He therefore informed the staff at [facility], who then sent her over to the ER for further evaluation. In the Emergency Room, when the patient arrived, she was aphasic and was not able to talk much. Symptoms are very suspicious for a stroke. Therefore, a neurologist was consulted. Although her aphasia has significantly improved, therefore, she did not require any thrombolytics. Given her progressive neurological symptoms, hospitalist services were contacted for further treatment and evaluation. The patient does admit to a mild headache presently. No nausea, no vomiting, no chest pain, no shortness of breath. No fevers, chills, cough. No numbness, no tingling, no seizure-type activity, and no other acute associated symptoms presently.   REVIEW OF SYSTEMS:   CONSTITUTIONAL:  No documented fever, no weight gain, no weight loss.  EYES:  No blurred or double vision.  ENT:  No tinnitus, no postnasal drip, no redness of the oropharynx.  RESPIRATORY:  No cough, no wheeze, no hemoptysis, no dyspnea.  CARDIOVASCULAR:  No chest pain, no orthopnea, no palpitations, no syncope.  GASTROINTESTINAL:  No nausea, no vomiting, no diarrhea, no abdominal pain, no melena or hematochezia.  GENITOURINARY:  No dysuria, no hematuria.  ENDOCRINE:  No polyuria or nocturia. No heat or cold intolerance.   HEMATOLOGIC:  No anemia, no bruising, no bleeding.  INTEGUMENT:  No rashes. No lesions.  MUSCULOSKELETAL:  No arthritis, no swelling, no gout.  NEUROLOGIC:  No numbness, no tingling, no ataxia, no  seizure-type activity.  PSYCHIATRIC:  No anxiety,  no insomnia, no ADD. Positive depression.   ALLERGIES:  DEMEROL, EPINEPHRINE, MACROBID and PENICILLIN.   SOCIAL HISTORY:  No smoking. No alcohol abuse. No illicit drug abuse. Lives at [facility] independent living.   FAMILY HISTORY:  Mother died from cancer of unknown type. Father died from lung cancer.   CURRENT MEDICATIONS: 1.  Aspirin 81 mg daily.  2.  Sinemet 25/100 one tab t.i.d.  3.  Ciprofloxacin 500 mg b.i.d.  4.  Cymbalta 30 mg daily.  5.  Glyburide metformin 5/500 one tab b.i.d.  6.  Neurontin 300 mg b.i.d.  7.  Sublingual nitroglycerin as needed.  8.  Pravachol 40 mg daily.  9.  Senokot 1 tab at bedtime.  10.  Toprol 50 mg daily.  11.  Tylenol extra strength 1 to 2 tabs q. 4 hours as needed.  12.  Vitamin B12 at 1000 mcg monthly.  13.  Vitamin C 500 mg daily.  14.  Vitamin D3 at 1000 international units daily.   PHYSICAL EXAMINATION ON ADMISSION:  VITAL SIGNS:  Temperature is 98.8, pulse 76, respirations 18, blood pressure 192/88, sats 97% on 2 L nasal cannula.  GENERAL:  She is a pleasant appearing female in no apparent distress.  HEENT:  She is atraumatic, normocephalic. Extraocular muscles are intact. Pupils equal and reactive to light. Sclerae is anicteric. No conjunctival injection. No pharyngeal erythema.  NECK:  Supple. There is no jugular venous distention, no bruits, no lymphadenopathy, no thyromegaly.  HEART:  Regular rate and rhythm. No murmurs, rubs or clicks.  LUNGS:  Clear  to auscultation bilaterally. No rales, no rhonchi, no wheezes.  ABDOMEN:  Soft, flat, nontender, nondistended. Has good bowel sounds. No hepatosplenomegaly appreciated.  EXTREMITIES:  No evidence of any cyanosis, clubbing or peripheral edema. Has +2 pedal and radial pulses bilaterally.  NEUROLOGIC:  The patient is alert, awake, oriented x 2. Moves all extremities spontaneously. Does have a positive Parkinsonian tremor. Also appears to have  some mild aphasia, but no other acute focal motor or sensory deficits appreciated bilaterally.  SKIN:  Moist and warm with no rashes.  LYMPHATIC:  There is no cervical or axillary lymphadenopathy.   LABORATORY EXAMINATION:  Serum glucose of 122, BUN 21, creatinine 0.9, sodium 141, potassium 4, chloride 103, bicarbonate 28. LFTs within normal limits. Troponin less than 0.02. White cell count 5.3, hemoglobin 13.1, hematocrit 37.3, platelet count 193. INR 0.9.   The patient did have a CT of the head done without contrast showing no evidence of acute intracranial process.   The patient also had a chest x-ray done, which showed no evidence of any acute cardiopulmonary disease.   ASSESSMENT AND PLAN:  This is an 81 year old female with history of Parkinson's disease, hypertension, depression, hyperlipidemia, diabetes, recent urinary tract infection, who presents to the hospital with slurred speech and noted to have aphasia.  1.  Cerebrovascular accident. This is likely the suspected diagnosis given the fact that the patient presented with some aphasia and slurred speech. Her aphasia has since then improved. Her initial CT of the head is negative, but her symptoms are quite suspicious for cerebrovascular accident. Therefore, we will get an MRI of her brain, get a carotid duplex and two-dimensional echocardiogram. Follow q. 4-hour neurologic checks. Continue on aspirin and statin for now. We will go ahead and get a physical therapy and speech evaluation.  2.  Malignant hypertension. This is probably related to her acute cerebrovascular accident and likely Cushing's reflex. We will tolerate some element of hypertension. Goal systolic blood pressure is 170, diastolic blood pressure 90. We will continue her Toprol for now. We will start some p.r.n. labetalol and hydralazine and follow her blood pressures closely.  3.  Parkinson's disease. Continue with her Sinemet.  4.  Diabetes. We will place her on sliding  scale insulin. Hold glyburide and metformin for now.  5.  Hyperlipidemia. Continue Pravachol.  6.  Depression. Continue Cymbalta.   7.  Recent urinary tract infection. Continue with ciprofloxacin.  CODE STATUS:  The patient is a DO NOT INTUBATE, DO NOT RESUSCITATE.   TIME SPENT ON ADMISSION: 50 minutes.   ____________________________ Rolly Pancake. Cherlynn Kaiser, MD vjs:ms D: 07/06/2012 20:24:33 ET T: 07/06/2012 20:45:55 ET JOB#: [id number]  cc: Rolly Pancake. Cherlynn Kaiser, MD, <Dictator> Houston Siren MD ELECTRONICALLY SIGNED 07/10/2012 8:19
